# Patient Record
Sex: Male | Born: 1970 | Race: White | Hispanic: No | Marital: Married | Smoking: Former smoker
Health system: Southern US, Community
[De-identification: ages and names within clinical notes are randomized; demographics above are authoritative.]

## PROBLEM LIST (undated history)

## (undated) DIAGNOSIS — G40909 Epilepsy, unspecified, not intractable, without status epilepticus: Secondary | ICD-10-CM

## (undated) DIAGNOSIS — E785 Hyperlipidemia, unspecified: Secondary | ICD-10-CM

## (undated) HISTORY — PX: CHOLECYSTECTOMY: SHX55

---

## 2004-11-01 ENCOUNTER — Emergency Department: Payer: Self-pay | Admitting: Unknown Physician Specialty

## 2005-01-16 ENCOUNTER — Ambulatory Visit: Payer: Self-pay | Admitting: Family Medicine

## 2005-02-22 ENCOUNTER — Ambulatory Visit: Payer: Self-pay | Admitting: Surgery

## 2006-11-17 IMAGING — US ABDOMEN ULTRASOUND
1 series · 14 of 25 positions shown · non-contrast
Comparison: none

REASON FOR EXAM: Abdominal pain both sides sudden onset
COMMENTS:

[Series 1: abdomen ultrasound · 0.54mm/px · 43 acquisitions, 14 frames shown]
[im 1/43]
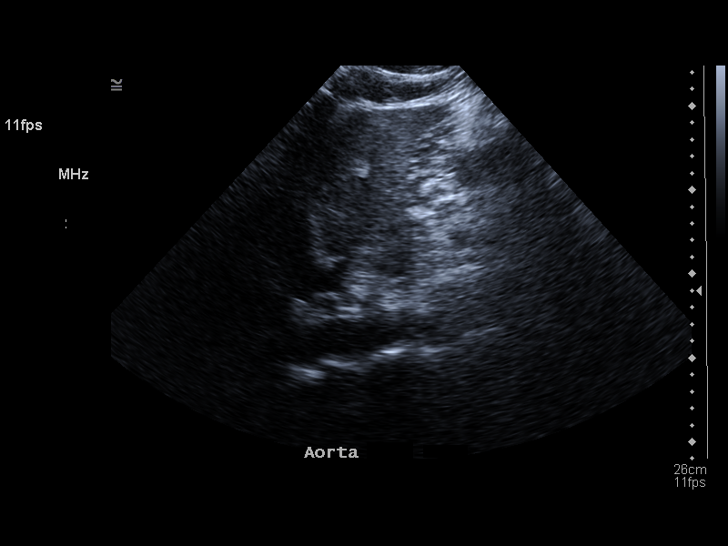
[im 4/43]
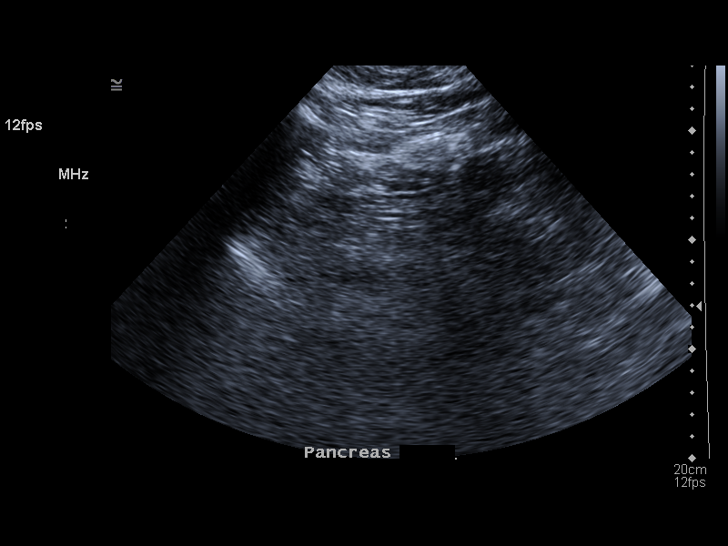
[im 8/43]
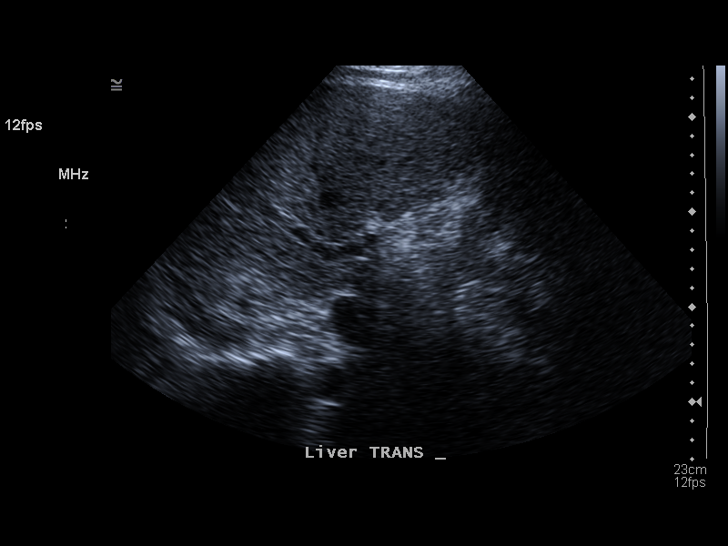
[im 11/43]
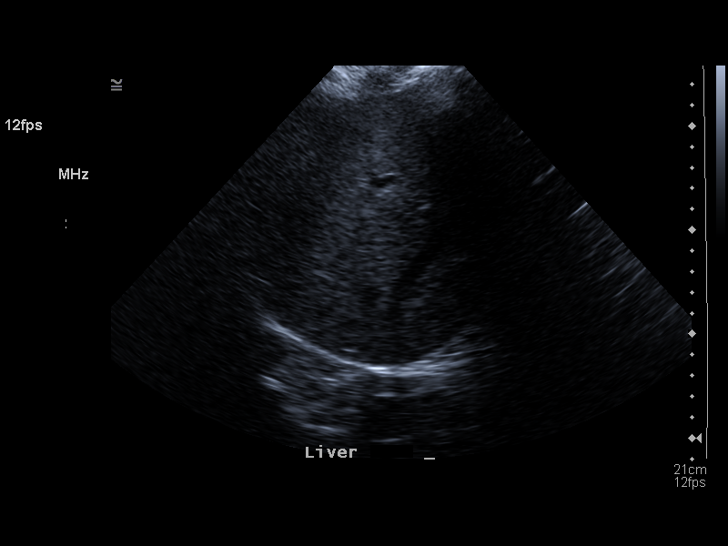
[im 15/43]
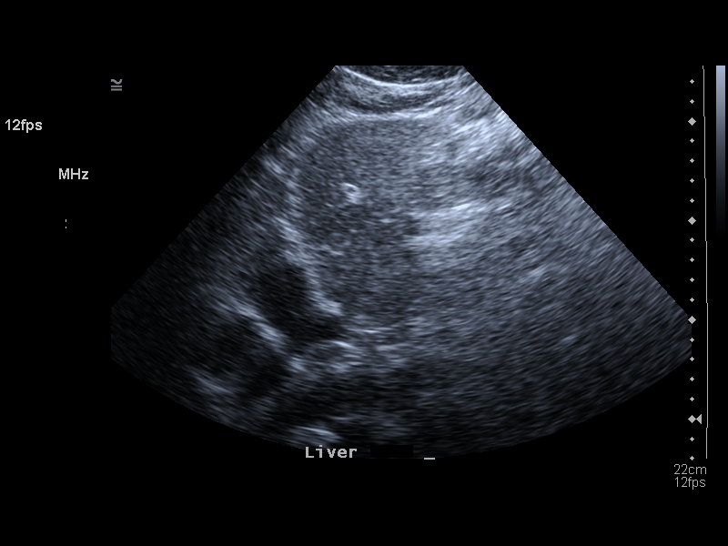
[im 16/43]
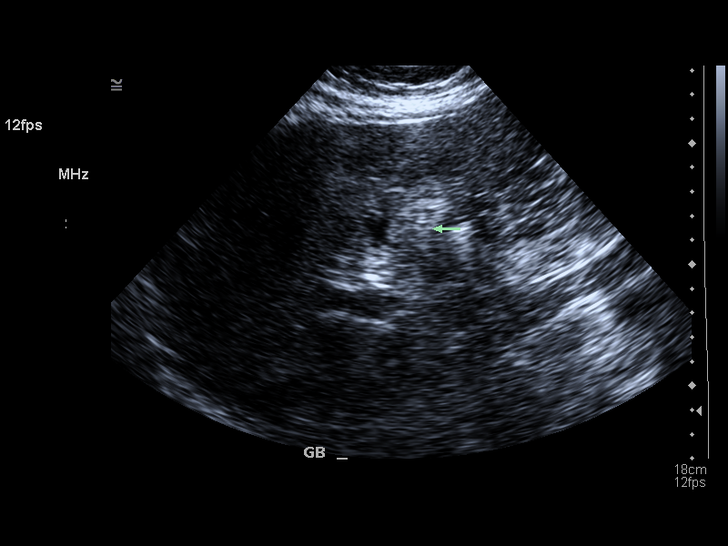
[im 20/43]
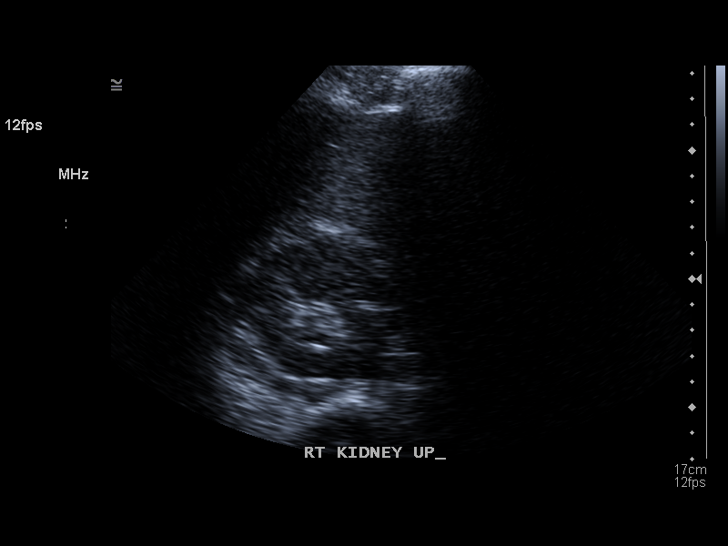
[im 23/43]
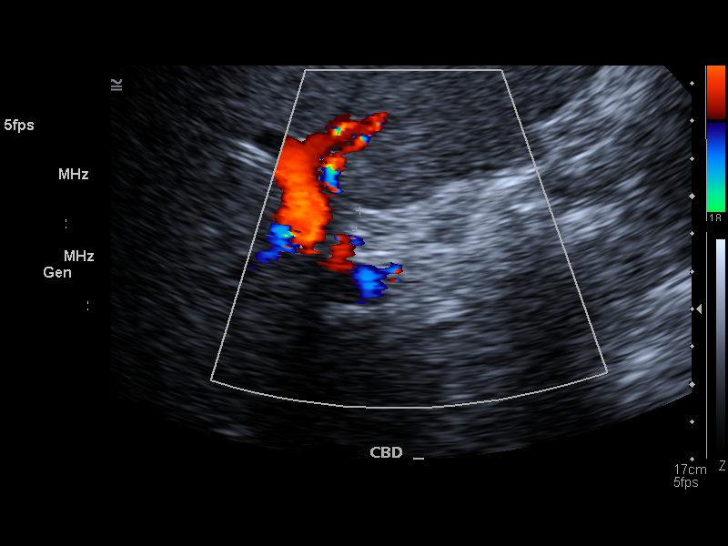
[im 27/43]
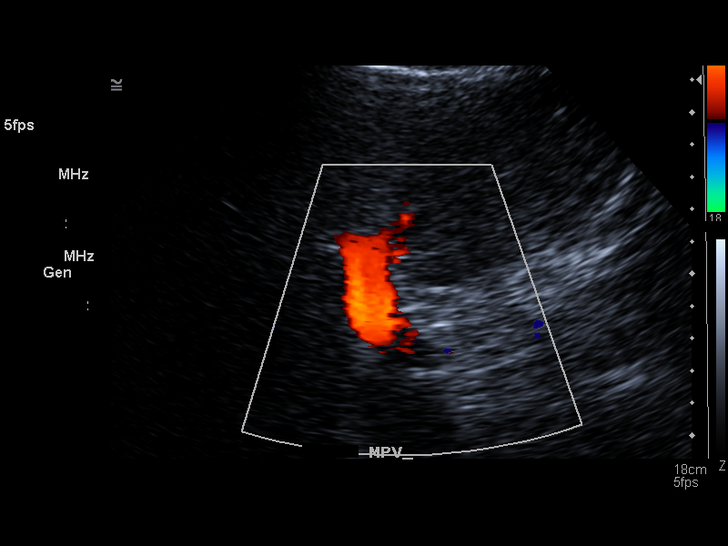
[im 29/43]
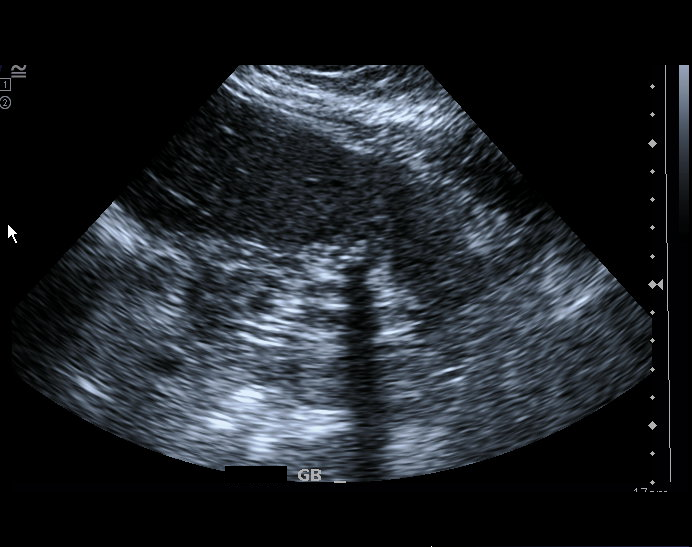
[im 32/43]
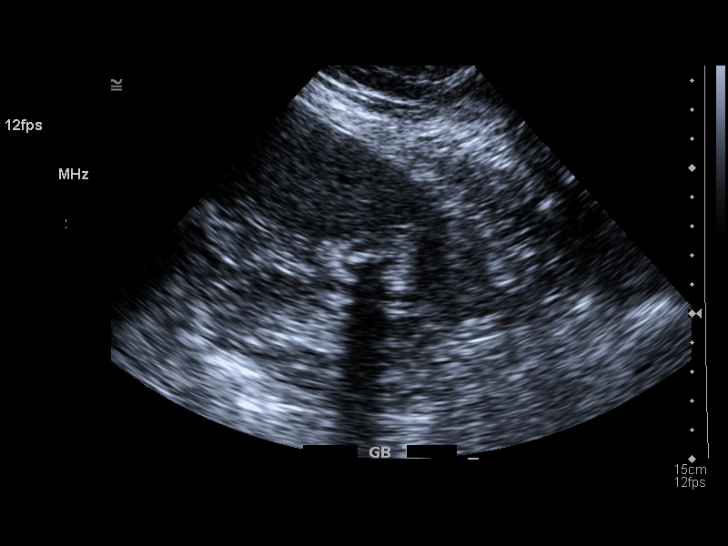
[im 36/43]
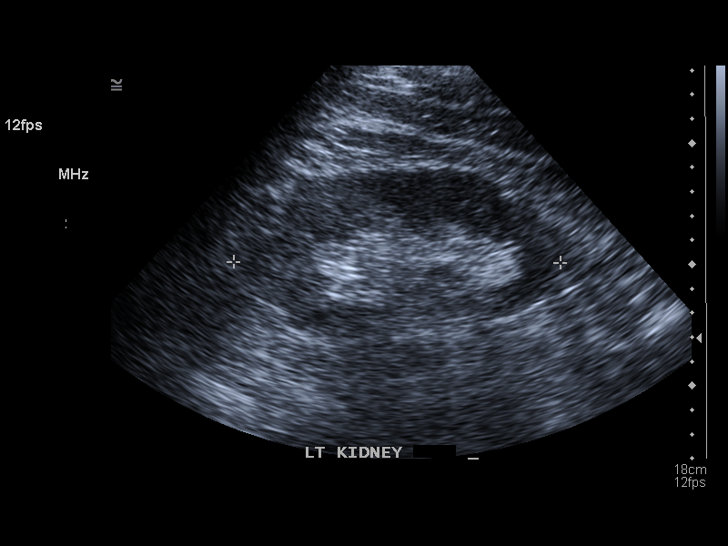
[im 39/43]
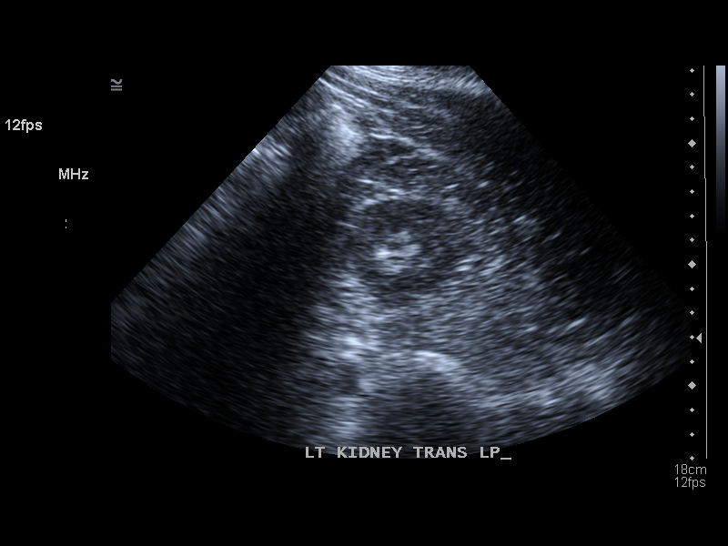
[im 43/43]
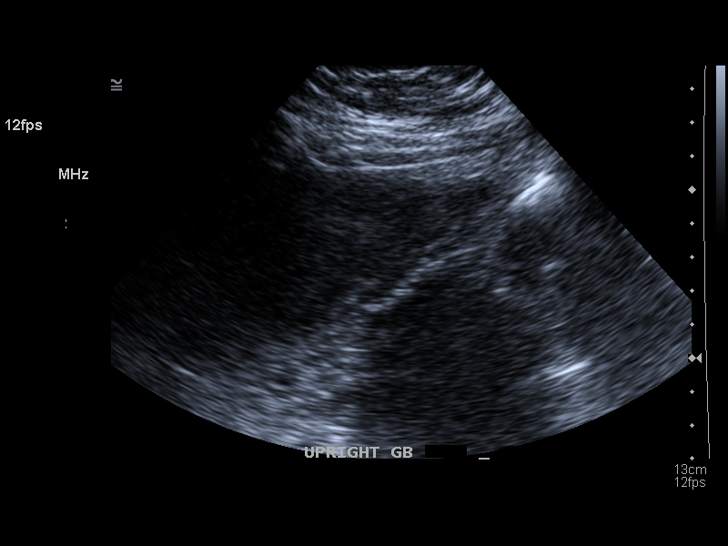

[14 of 25 positions shown; findings below may reference images not displayed]

PROCEDURE:     US  - US ABDOMEN GENERAL SURVEY  - January 16, 2005  [DATE]

RESULT:        The liver and spleen are normal in appearance.  The pancreas
and the abdominal aorta are not well seen on this exam.  There are noted
echo densities in the region of the gallbladder bed compatible with
gallstones and a contracted gallbladder.  The gallbladder wall is not
sufficiently visualized for accurate measurement of wall thickness.  The
common bile duct measures 5.4 mm in diameter which is within normal limits.
The kidneys show no hydronephrosis.  There is no ascites.
IMPRESSION: 1.     Cholelithiasis.
2.     The pancreas is not visualized adequately for evaluation on this exam.

## 2016-01-14 ENCOUNTER — Ambulatory Visit
Admission: EM | Admit: 2016-01-14 | Discharge: 2016-01-14 | Disposition: A | Discharge status: Home / Self Care | Payer: BC Managed Care – PPO | Attending: Family Medicine | Admitting: Family Medicine

## 2016-01-14 ENCOUNTER — Encounter: Payer: Self-pay | Admitting: Emergency Medicine

## 2016-01-14 DIAGNOSIS — J0110 Acute frontal sinusitis, unspecified: Secondary | ICD-10-CM

## 2016-01-14 DIAGNOSIS — J0100 Acute maxillary sinusitis, unspecified: Secondary | ICD-10-CM

## 2016-01-14 HISTORY — DX: Hyperlipidemia, unspecified: E78.5

## 2016-01-14 HISTORY — DX: Epilepsy, unspecified, not intractable, without status epilepticus: G40.909

## 2016-01-14 MED ORDER — AMOXICILLIN-POT CLAVULANATE 875-125 MG PO TABS: 1.0000 | ORAL_TABLET | Freq: Two times a day (BID) | ORAL | 0 refills | Status: AC

## 2016-01-14 NOTE — ED Provider Notes (Signed)
[facility] ____________________________________________  Time seen: Approximately 9:27 AM  I have reviewed the triage vital signs and the nursing notes.   HISTORY  Chief Complaint Facial Pain   HPI Gregory Buchanan is a 45 y.o. male is a the complaints of 2 weeks of runny nose, nasal congestion and intermittent cough. Patient reports his symptoms started to improve and then worsened over the last 3-4 days. Patient reports symptoms never fully resolved. Patient reports nasal congestion and sinus pressure is biggest complaints. Patient reports sinus pressure radiates into his teeth. Denies known fevers. Reports some recent sick contacts in his family. Reports overall continues to eat and drink well. Reports over-the-counter medications have been minimally beneficial.   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 Patient reports has been seizures free for greater than 8 years.  Denies chest pain, shortness of breath, abdominal pain, dysuria, neck pain, back pain, extremity pain or extremity swelling.      Past Medical History:  Diagnosis Date  . Epilepsy (HCC)   . Hyperlipidemia     There are no active problems to display for this patient.   Past Surgical History:  Procedure Laterality Date  . CHOLECYSTECTOMY      No current facility-administered medications for this encounter.   Current Outpatient Prescriptions:  .  Garlic 300 MG TABS, Take 600 mg by mouth daily., Disp: , Rfl:  .  Multiple Vitamin (MULTIVITAMIN) tablet, Take 1 tablet by mouth daily., Disp: , Rfl:  .  omega-3 fish oil (MAXEPA) 1000 MG CAPS capsule, Take 1 capsule by mouth daily., Disp: , Rfl:  .  phenytoin (DILANTIN) 100 MG ER capsule, Take 300 mg by mouth every morning., Disp: , Rfl:  .  phenytoin (DILANTIN) 100 MG ER capsule, Take 200 mg by mouth every evening., Disp: , Rfl:  .  saw palmetto 160 MG capsule, Take 160 mg by mouth daily., Disp: ,  Rfl:  .  TURMERIC PO, Take 1 tablet by mouth daily., Disp: , Rfl:  .  amoxicillin-clavulanate (AUGMENTIN) 875-125 MG tablet, Take 1 tablet by mouth every 12 (twelve) hours., Disp: 20 tablet, Rfl: 0  Allergies Patient has no known allergies.  History reviewed. No pertinent family history.  Social History Social History  Substance Use Topics  . Smoking status: Former Games developermoker  . Smokeless tobacco: Never Used  . Alcohol use No    Review of Systems Constitutional: No fever/chills Eyes: No visual changes. ENT: No sore throat.As above.  Cardiovascular: Denies chest pain. Respiratory: Denies shortness of breath. Gastrointestinal: No abdominal pain.  No nausea, no vomiting.  No diarrhea.  No constipation. Genitourinary: Negative for dysuria. Musculoskeletal: Negative for back pain. Skin: Negative for rash. Neurological: Negative for headaches, focal weakness or numbness.  10-point ROS otherwise negative.  ____________________________________________   PHYSICAL EXAM:  VITAL SIGNS: ED Triage Vitals  Enc Vitals Group     BP 01/14/16 0853 138/77     Pulse Rate 01/14/16 0853 94     Resp 01/14/16 0853 16     Temp 01/14/16 0853 98.5 F (36.9 C)     Temp Source 01/14/16 0853 Oral     SpO2 01/14/16 0853 98 %     Weight 01/14/16 0851 250 lb (113.4 kg)     Height 01/14/16 0851 5\' 11"  (1.803 m)     Head Circumference --      Peak Flow --      Pain Score 01/14/16 0855 2  Pain Loc --      Pain Edu? --      Excl. in GC? --     Constitutional: Alert and oriented. Well appearing and in no acute distress. Eyes: Conjunctivae are normal. PERRL. EOMI. Head: Atraumatic.Mild to moderate tenderness to palpation bilateral frontal and maxillary sinuses. No swelling. No erythema.   Ears: no erythema, normal TMs bilaterally.   Nose: nasal congestion with bilateral nasal turbinate erythema and edema.   Mouth/Throat: Mucous membranes are moist.  Oropharynx non-erythematous.No tonsillar  swelling or exudate.  Neck: No stridor.  No cervical spine tenderness to palpation. Hematological/Lymphatic/Immunilogical: No cervical lymphadenopathy. Cardiovascular: Normal rate, regular rhythm. Grossly normal heart sounds.  Good peripheral circulation. Respiratory: Normal respiratory effort.  No retractions. Lungs CTAB. No wheezes, rales or rhonchi. Good air movement.  Gastrointestinal: Soft and nontender. No distention.  Musculoskeletal: Ambulatory with steady gait. No cervical, thoracic or lumbar tenderness to palpation.  Neurologic:  Normal speech and language. No gross focal neurologic deficits are appreciated. No gait instability. Skin:  Skin is warm, dry and intact. No rash noted. Psychiatric: Mood and affect are normal. Speech and behavior are normal.  ___________________________________________   LABS (all labs ordered are listed, but only abnormal results are displayed)  Labs Reviewed - No data to display ____________________________________________   PROCEDURES Procedures   INITIAL IMPRESSION / ASSESSMENT AND PLAN / ED COURSE  Pertinent labs & imaging results that were available during my care of the patient were reviewed by me and considered in my medical decision making (see chart for details).  Well-appearing patient. No acute distress. Suspect frontal maxillary sinusitis. Will treat patient with oral Augmentin. Encouraged supportive care, rest, fluids, over-the-counter Sudafed.Discussed indication, risks and benefits of medications with patient.  Discussed follow up with Primary care physician this week. Discussed follow up and return parameters including no resolution or any worsening concerns. Patient verbalized understanding and agreed to plan.   ____________________________________________   FINAL CLINICAL IMPRESSION(S) / ED DIAGNOSES  Final diagnoses:  Acute frontal sinusitis, recurrence not specified  Acute maxillary sinusitis, recurrence not specified      Discharge Medication List as of 01/14/2016  9:34 AM    START taking these medications   Details  amoxicillin-clavulanate (AUGMENTIN) 875-125 MG tablet Take 1 tablet by mouth every 12 (twelve) hours., Starting Sun 01/14/2016, Normal        Note: This dictation was prepared with Dragon dictation along with smaller phrase technology. Any transcriptional errors that result from this process are unintentional.    Clinical Course       Renford DillsLindsey Omeed Osuna, NP 01/14/16 1024

## 2016-01-14 NOTE — ED Triage Notes (Signed)
Patient c/o sinus congestion and pressure, cough, HAs, and runny nose for 4 days. Patient denies fevers.

## 2016-01-14 NOTE — Discharge Instructions (Signed)
Take medication as prescribed. Rest. Drink plenty of fluids.  ° °Follow up with your primary care physician this week as needed. Return to Urgent care for new or worsening concerns.  ° °

## 2016-01-17 ENCOUNTER — Telehealth: Payer: Self-pay

## 2016-01-17 NOTE — Telephone Encounter (Signed)
Courtesy call back completed today for patient's recent visit at [facility]. Patient did not answer, left message on machine to call back with any questions or concerns.   

## 2018-01-01 ENCOUNTER — Ambulatory Visit: Payer: BC Managed Care – PPO | Attending: Neurology

## 2018-01-01 DIAGNOSIS — G4733 Obstructive sleep apnea (adult) (pediatric): Secondary | ICD-10-CM | POA: Diagnosis present

## 2021-12-20 ENCOUNTER — Other Ambulatory Visit: Payer: Self-pay | Admitting: Family Medicine

## 2021-12-20 DIAGNOSIS — E782 Mixed hyperlipidemia: Secondary | ICD-10-CM

## 2021-12-20 DIAGNOSIS — Z8669 Personal history of other diseases of the nervous system and sense organs: Secondary | ICD-10-CM

## 2021-12-20 DIAGNOSIS — Z9189 Other specified personal risk factors, not elsewhere classified: Secondary | ICD-10-CM

## 2021-12-28 ENCOUNTER — Ambulatory Visit
Admission: RE | Admit: 2021-12-28 | Discharge: 2021-12-28 | Disposition: A | Discharge status: Home / Self Care | Payer: No Typology Code available for payment source | Source: Ambulatory Visit | Attending: Family Medicine | Admitting: Family Medicine

## 2021-12-28 DIAGNOSIS — E782 Mixed hyperlipidemia: Secondary | ICD-10-CM

## 2021-12-28 DIAGNOSIS — Z8669 Personal history of other diseases of the nervous system and sense organs: Secondary | ICD-10-CM

## 2021-12-28 DIAGNOSIS — Z9189 Other specified personal risk factors, not elsewhere classified: Secondary | ICD-10-CM

## 2022-07-03 ENCOUNTER — Ambulatory Visit: Payer: BC Managed Care – PPO

## 2022-07-03 DIAGNOSIS — D120 Benign neoplasm of cecum: Secondary | ICD-10-CM

## 2022-07-03 DIAGNOSIS — D123 Benign neoplasm of transverse colon: Secondary | ICD-10-CM

## 2022-07-03 DIAGNOSIS — K640 First degree hemorrhoids: Secondary | ICD-10-CM

## 2022-07-03 DIAGNOSIS — D122 Benign neoplasm of ascending colon: Secondary | ICD-10-CM

## 2022-07-03 DIAGNOSIS — K621 Rectal polyp: Secondary | ICD-10-CM

## 2022-07-03 DIAGNOSIS — D124 Benign neoplasm of descending colon: Secondary | ICD-10-CM

## 2022-07-03 DIAGNOSIS — Z1211 Encounter for screening for malignant neoplasm of colon: Secondary | ICD-10-CM | POA: Diagnosis not present
